# Patient Record
Sex: Male | Born: 1980 | Race: White | Hispanic: No | Marital: Married | Smoking: Former smoker
Health system: Southern US, Community
[De-identification: ages and names within clinical notes are randomized; demographics above are authoritative.]

## PROBLEM LIST (undated history)

## (undated) DIAGNOSIS — F411 Generalized anxiety disorder: Secondary | ICD-10-CM

## (undated) DIAGNOSIS — F3181 Bipolar II disorder: Secondary | ICD-10-CM

## (undated) DIAGNOSIS — K635 Polyp of colon: Secondary | ICD-10-CM

## (undated) DIAGNOSIS — F909 Attention-deficit hyperactivity disorder, unspecified type: Secondary | ICD-10-CM

## (undated) HISTORY — DX: Polyp of colon: K63.5

## (undated) HISTORY — DX: Bipolar II disorder: F31.81

## (undated) HISTORY — DX: Attention-deficit hyperactivity disorder, unspecified type: F90.9

## (undated) HISTORY — PX: SEPTOPLASTY: SUR1290

## (undated) HISTORY — DX: Morbid (severe) obesity due to excess calories: E66.01

## (undated) HISTORY — DX: Generalized anxiety disorder: F41.1

---

## 2017-06-22 DIAGNOSIS — R200 Anesthesia of skin: Secondary | ICD-10-CM | POA: Diagnosis not present

## 2017-06-22 DIAGNOSIS — M62830 Muscle spasm of back: Secondary | ICD-10-CM | POA: Diagnosis not present

## 2018-01-28 ENCOUNTER — Other Ambulatory Visit: Payer: Self-pay | Admitting: Nurse Practitioner

## 2018-01-28 ENCOUNTER — Ambulatory Visit
Admission: RE | Admit: 2018-01-28 | Discharge: 2018-01-28 | Disposition: A | Discharge status: Home / Self Care | Payer: 59 | Source: Ambulatory Visit | Attending: Nurse Practitioner | Admitting: Nurse Practitioner

## 2018-01-28 DIAGNOSIS — M62830 Muscle spasm of back: Secondary | ICD-10-CM

## 2018-01-28 DIAGNOSIS — M47816 Spondylosis without myelopathy or radiculopathy, lumbar region: Secondary | ICD-10-CM | POA: Diagnosis not present

## 2018-08-06 DIAGNOSIS — H608X9 Other otitis externa, unspecified ear: Secondary | ICD-10-CM | POA: Diagnosis not present

## 2019-05-06 IMAGING — CR DG LUMBAR SPINE 2-3V
3 series · 3 of 3 positions shown · non-contrast
Comparison: None.

CLINICAL DATA: Bilateral low back pain for 1 week, no injury

EXAM:
LUMBAR SPINE - 2-3 VIEW

[t l-spine a.p.]
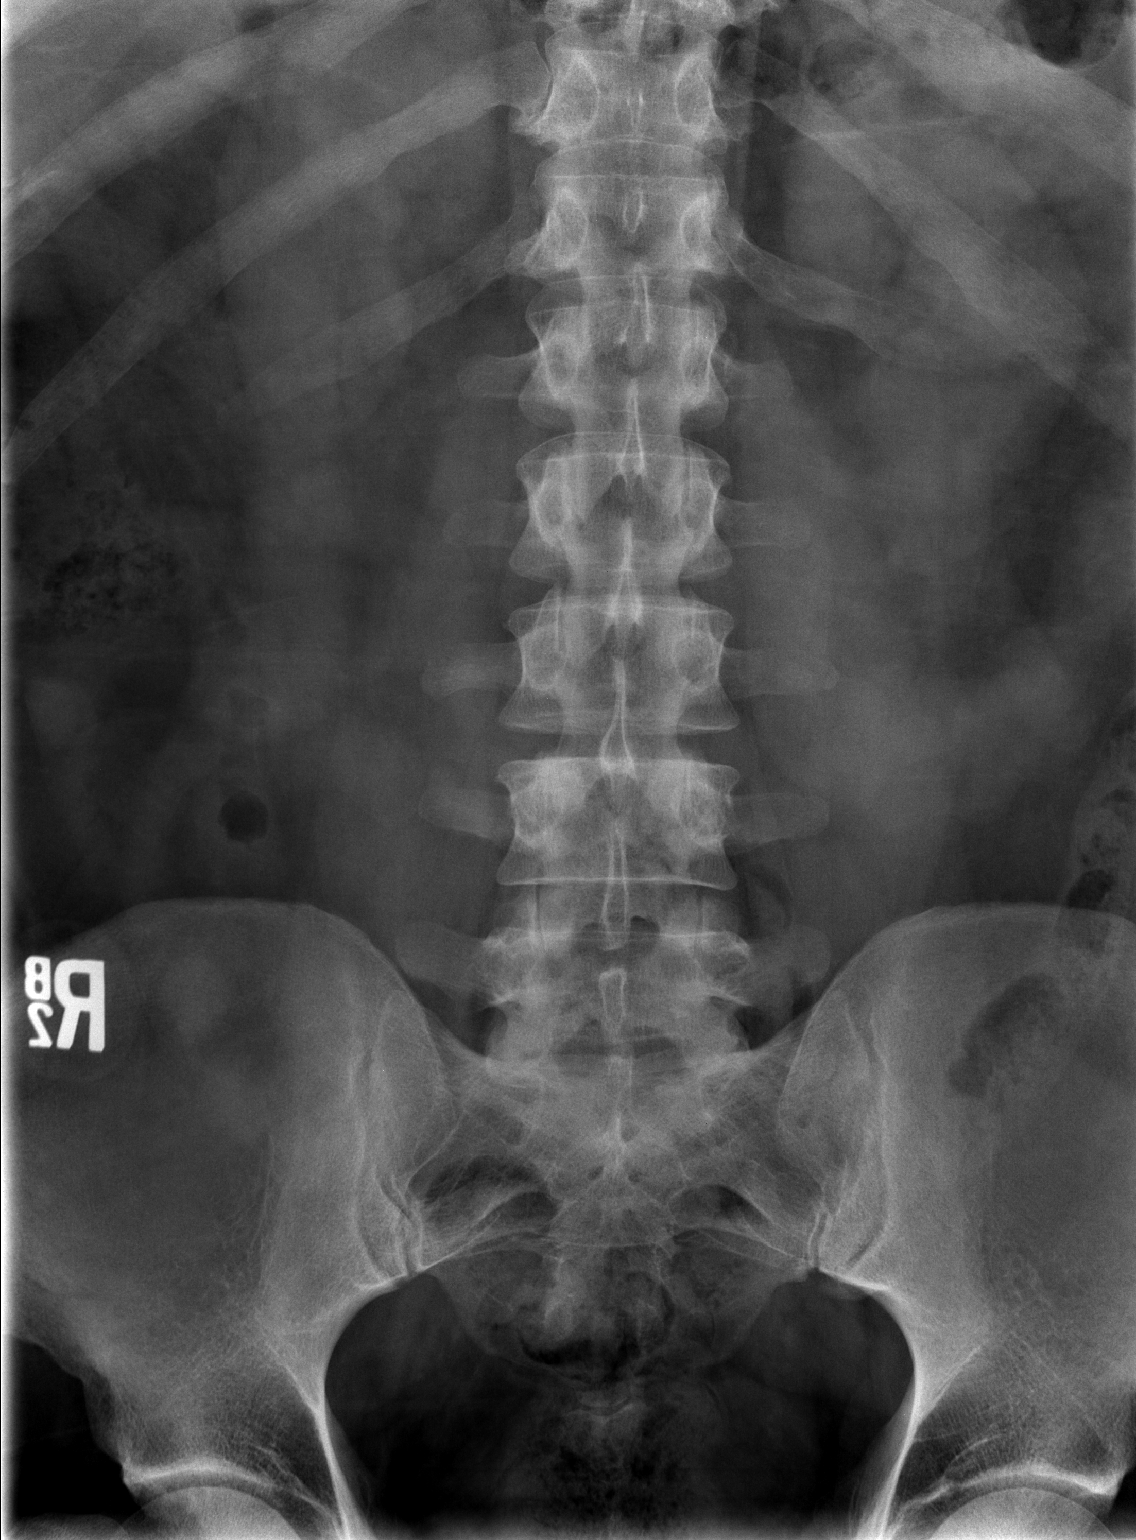

[t l-spine lat]
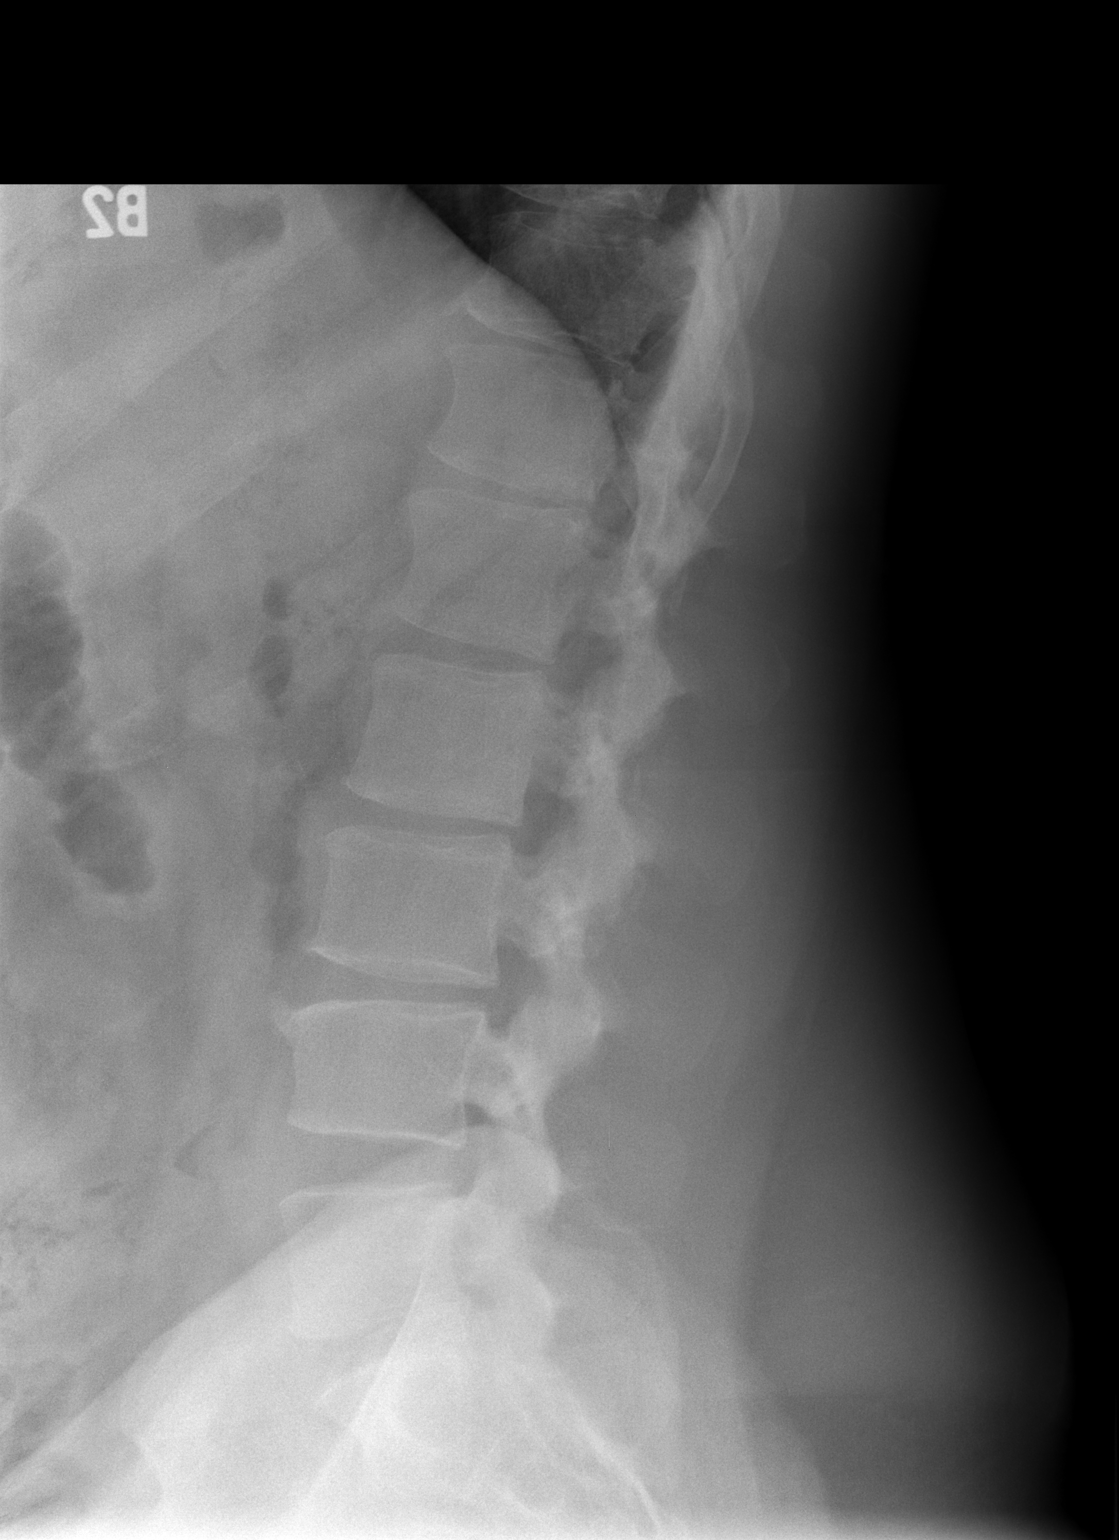

[t l-spine l5-s1 spot]
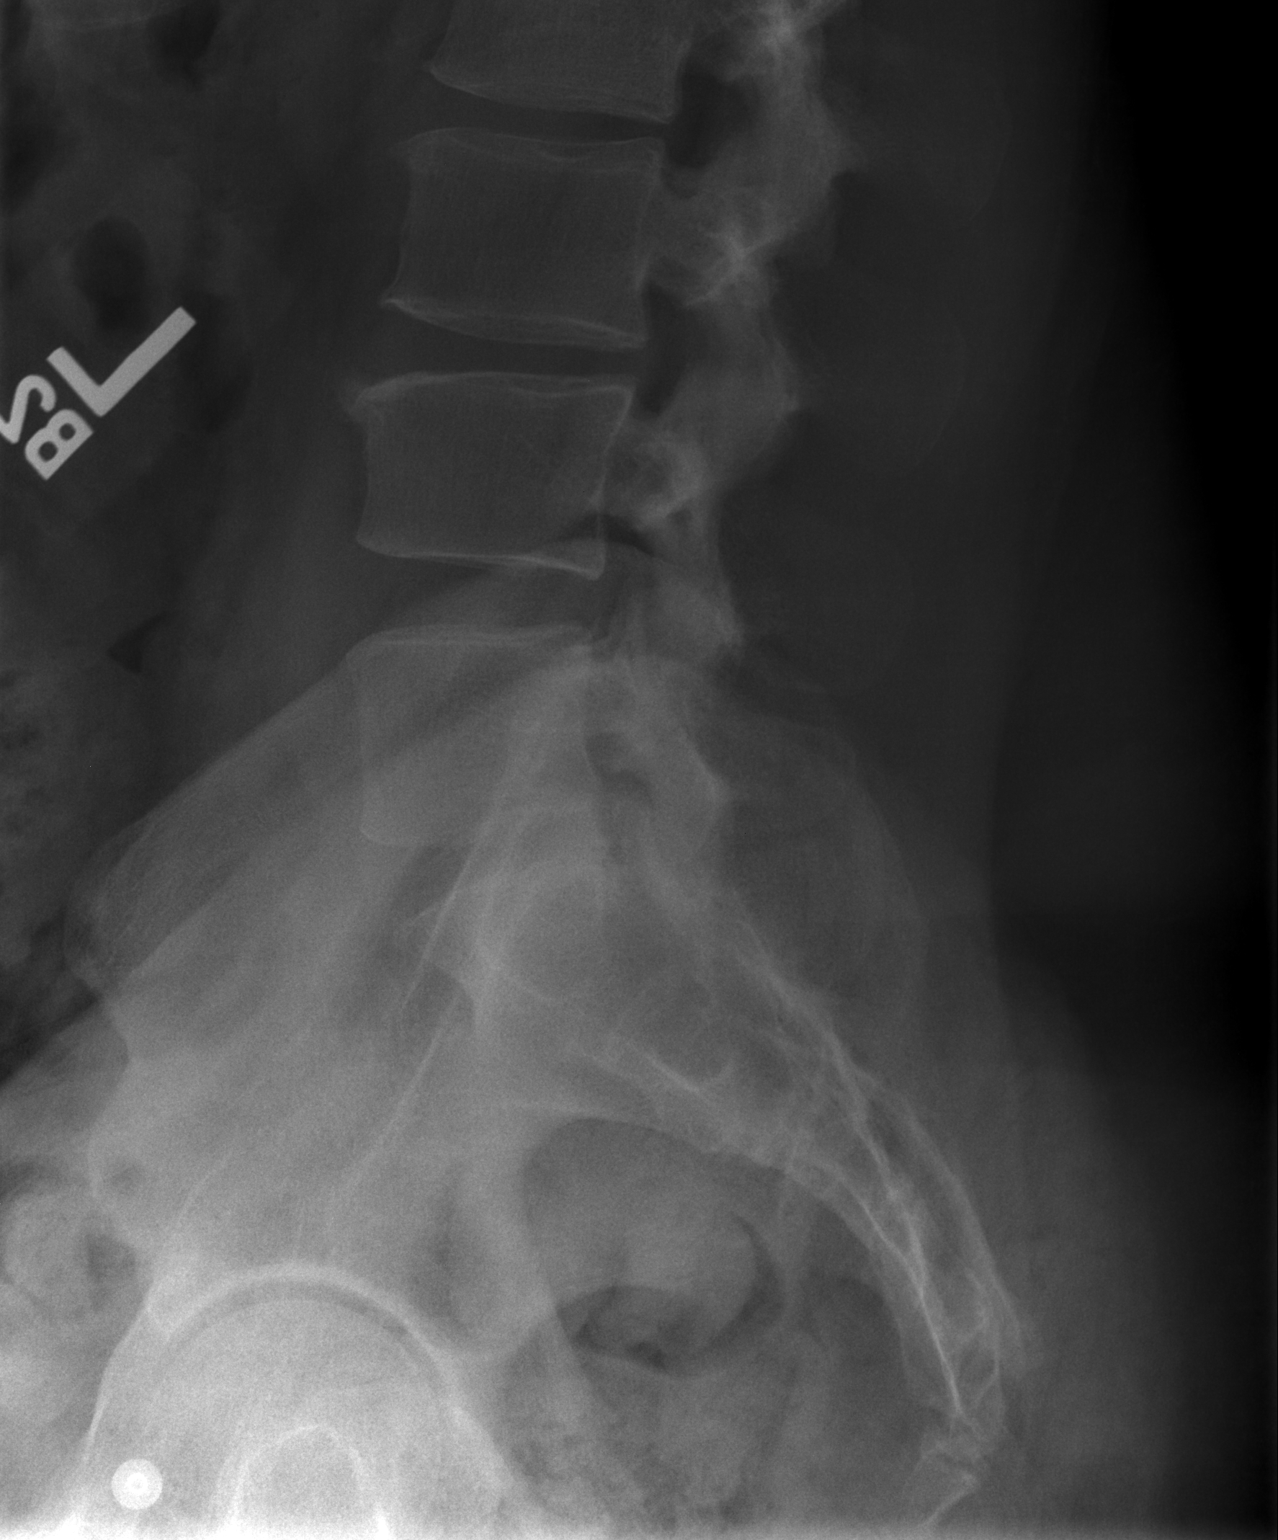

[3 of 3 positions shown; findings below may reference images not displayed]

FINDINGS: The lumbar vertebrae are in normal alignment. Intervertebral disc
spaces appear normal. Very mild degenerative spurring is noted
anteriorly at the L3-4 level. No compression deformity is seen. The
SI joints appear corticated. The bowel gas pattern is nonspecific.
IMPRESSION: Normal alignment with minimal degenerative change at L3-4. No
compression deformity.

## 2019-10-10 ENCOUNTER — Other Ambulatory Visit: Payer: Self-pay | Admitting: Otolaryngology

## 2019-10-10 DIAGNOSIS — J329 Chronic sinusitis, unspecified: Secondary | ICD-10-CM

## 2019-10-21 ENCOUNTER — Other Ambulatory Visit: Payer: Self-pay

## 2019-10-21 ENCOUNTER — Ambulatory Visit
Admission: RE | Admit: 2019-10-21 | Discharge: 2019-10-21 | Disposition: A | Discharge status: Home / Self Care | Payer: 59 | Source: Ambulatory Visit | Attending: Otolaryngology | Admitting: Otolaryngology

## 2019-10-21 DIAGNOSIS — J329 Chronic sinusitis, unspecified: Secondary | ICD-10-CM

## 2020-09-04 ENCOUNTER — Other Ambulatory Visit: Payer: Self-pay

## 2020-09-05 ENCOUNTER — Encounter: Payer: Self-pay | Admitting: Internal Medicine

## 2020-09-05 ENCOUNTER — Ambulatory Visit (INDEPENDENT_AMBULATORY_CARE_PROVIDER_SITE_OTHER): Payer: Managed Care, Other (non HMO) | Admitting: Internal Medicine

## 2020-09-05 DIAGNOSIS — F3181 Bipolar II disorder: Secondary | ICD-10-CM | POA: Diagnosis not present

## 2020-09-05 DIAGNOSIS — F900 Attention-deficit hyperactivity disorder, predominantly inattentive type: Secondary | ICD-10-CM | POA: Diagnosis not present

## 2020-09-05 DIAGNOSIS — F411 Generalized anxiety disorder: Secondary | ICD-10-CM | POA: Diagnosis not present

## 2020-09-05 DIAGNOSIS — F909 Attention-deficit hyperactivity disorder, unspecified type: Secondary | ICD-10-CM | POA: Insufficient documentation

## 2020-09-05 DIAGNOSIS — K635 Polyp of colon: Secondary | ICD-10-CM

## 2020-09-05 NOTE — Progress Notes (Signed)
New Patient Office Visit     This visit occurred during the SARS-CoV-2 public health emergency.  Safety protocols were in place, including screening questions prior to the visit, additional usage of staff PPE, and extensive cleaning of exam room while observing appropriate contact time as indicated for disinfecting solutions.    CC/Reason for Visit: Establish care, discuss chronic medical conditions Previous PCP: None Last Visit: Unknown  HPI: Barry Myers is a 40 y.o. male who is coming in today for the above mentioned reasons. Past Medical History is significant for: Bipolar 2 disorder, generalized anxiety disorder and ADHD.  [facility].  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  He has had a septoplasty as his only past surgical history.  He has allergies to cephalosporins which cause hives.  His family history significant for a paternal grandfather with colon cancer, his father has been diagnosed with precancerous polyps.  He started colonoscopies because of this at age 40, he has also been diagnosed with precancerous polyps and has colonoscopies every 5 years by Dr. Elnoria Howard.  He is also morbidly obese with a BMI of 42.5.  He is wondering if I would take over his mental health prescriptions.  These include Abilify, buspirone, Vyvanse, Rexulti.  Recently changed insurance plans and and now it is cost-probitive for him to go to the [facility], they advised he seek a general practitioner.  He has no acute complaints today.   Past Medical/Surgical History: Past Medical History:  Diagnosis Date  . ADHD   . Bipolar 2 disorder (HCC)   . Colon polyps   . GAD (generalized anxiety disorder)   . Morbid obesity (HCC)     Past Surgical History:  Procedure Laterality Date  . SEPTOPLASTY      Social History:  reports that he has quit smoking. He quit after 18.00 years of use. He has never used smokeless tobacco. He reports  current alcohol use. No history on file for drug use.  Allergies: Allergies  Allergen Reactions  . Aleve [Naproxen] Hives  . Benzoyl Peroxide Hives  . Ceclor [Cefaclor] Hives    Family History:  Family History  Problem Relation Age of Onset  . Colon polyps Father   . Colon cancer Paternal Grandfather      Current Outpatient Medications:  .  ARIPiprazole (ABILIFY) 5 MG tablet, Take 5 mg by mouth daily., Disp: , Rfl:  .  busPIRone (BUSPAR) 30 MG tablet, Take 30 mg by mouth 2 (two) times daily., Disp: , Rfl:  .  finasteride (PROPECIA) 1 MG tablet, Take 1 mg by mouth daily., Disp: , Rfl:  .  REXULTI 2 MG TABS tablet, Take 2 mg by mouth daily., Disp: , Rfl:  .  VYVANSE 60 MG capsule, Take 60 mg by mouth every morning., Disp: , Rfl:   Review of Systems:  Constitutional: Denies fever, chills, diaphoresis, appetite change and fatigue.  HEENT: Denies photophobia, eye pain, redness, hearing loss, ear pain, congestion, sore throat, rhinorrhea, sneezing, mouth sores, trouble swallowing, neck pain, neck stiffness and tinnitus.   Respiratory: Denies SOB, DOE, cough, chest tightness,  and wheezing.   Cardiovascular: Denies chest pain, palpitations and leg swelling.  Gastrointestinal: Denies nausea, vomiting, abdominal pain, diarrhea, constipation, blood in stool and abdominal distention.  Genitourinary: Denies dysuria, urgency, frequency, hematuria, flank pain and difficulty urinating.  Endocrine: Denies: hot or cold intolerance, sweats, changes in hair or nails, polyuria, polydipsia.  Musculoskeletal: Denies myalgias, back pain, joint swelling, arthralgias and gait problem.  Skin: Denies pallor, rash and wound.  Neurological: Denies dizziness, seizures, syncope, weakness, light-headedness, numbness and headaches.  Hematological: Denies adenopathy. Easy bruising, personal or family bleeding history  Psychiatric/Behavioral: Denies suicidal ideation, mood changes, confusion, nervousness, sleep  disturbance and agitation    Physical Exam: Vitals:   09/05/20 1535  BP: 122/82  Pulse: 90  Temp: 98.1 F (36.7 C)  TempSrc: Oral  SpO2: 98%  Weight: (!) 305 lb 3.2 oz (138.4 kg)  Height: 5\' 11"  (1.803 m)   Body mass index is 42.57 kg/m.  Constitutional: NAD, calm, comfortable Eyes: PERRL, lids and conjunctivae normal ENMT: Mucous membranes are moist.  Neurologic: Grossly intact and nonfocal Psychiatric: Normal judgment and insight. Alert and oriented x 3. Normal mood.    Impression and Plan:  Bipolar 2 disorder (HCC) Attention deficit hyperactivity disorder (ADHD), predominantly inattentive type GAD (generalized anxiety disorder) -Have advised patient that I do not feel comfortable becoming his main mental health provider and taking over his prescriptions given multitude of medications and complexity of psychiatric diagnoses. -Have provided with several brochures with information on local mental health services. -I would be happy to see him however for continued medical care.  Polyp of colon, unspecified part of colon, unspecified type -Continue accelerated colonoscopy scheduled due to personal and family history.  Morbid obesity (HCC) -Discussed healthy lifestyle, including increased physical activity and better food choices to promote weight loss.      , MD [facility] at [facility]

## 2020-11-05 ENCOUNTER — Other Ambulatory Visit: Payer: Self-pay

## 2020-11-06 ENCOUNTER — Ambulatory Visit (INDEPENDENT_AMBULATORY_CARE_PROVIDER_SITE_OTHER): Payer: Managed Care, Other (non HMO) | Admitting: Internal Medicine

## 2020-11-06 ENCOUNTER — Encounter: Payer: Self-pay | Admitting: Internal Medicine

## 2020-11-06 VITALS — BP 122/86 | HR 89 | Temp 98.1°F | Ht 71.0 in | Wt 303.7 lb

## 2020-11-06 DIAGNOSIS — F900 Attention-deficit hyperactivity disorder, predominantly inattentive type: Secondary | ICD-10-CM

## 2020-11-06 DIAGNOSIS — F411 Generalized anxiety disorder: Secondary | ICD-10-CM

## 2020-11-06 DIAGNOSIS — Z0000 Encounter for general adult medical examination without abnormal findings: Secondary | ICD-10-CM | POA: Diagnosis not present

## 2020-11-06 DIAGNOSIS — F3181 Bipolar II disorder: Secondary | ICD-10-CM

## 2020-11-06 DIAGNOSIS — K635 Polyp of colon: Secondary | ICD-10-CM | POA: Diagnosis not present

## 2020-11-06 NOTE — Patient Instructions (Signed)
-  Nice seeing you today!!  -please return fasting for labs.  -Schedule follow up in 1 year or sooner as needed.

## 2020-11-06 NOTE — Progress Notes (Signed)
Established Patient Office Visit     This visit occurred during the SARS-CoV-2 public health emergency.  Safety protocols were in place, including screening questions prior to the visit, additional usage of staff PPE, and extensive cleaning of exam room while observing appropriate contact time as indicated for disinfecting solutions.    CC/Reason for Visit: Annual preventive exam  HPI: Barry Myers is a 40 y.o. male who is coming in today for the above mentioned reasons. Past Medical History is significant for: Morbid obesity, he is also followed by psychiatry for history of bipolar 2 disorder, generalized anxiety disorder and ADHD.  He has been doing well since we last spoke and has no acute concerns or complaints today.  He has a colonoscopy every 5 years due to history of colon polyps, was last done in 2021.  He has no personal or family history of prostate cancer.  All immunizations are up-to-date including COVID, Tdap, flu.  He is overdue for eye and dental care.  He exercises by walking on an almost daily basis.   Past Medical/Surgical History: Past Medical History:  Diagnosis Date   ADHD    Bipolar 2 disorder (HCC)    Colon polyps    GAD (generalized anxiety disorder)    Morbid obesity (HCC)     Past Surgical History:  Procedure Laterality Date   SEPTOPLASTY      Social History:  reports that he has quit smoking. His smoking use included cigarettes. He has never used smokeless tobacco. He reports current alcohol use. No history on file for drug use.  Allergies: Allergies  Allergen Reactions   Aleve [Naproxen] Hives   Benzoyl Peroxide Hives   Ceclor [Cefaclor] Hives    Family History:  Family History  Problem Relation Age of Onset   Colon polyps Father    Colon cancer Paternal Grandfather      Current Outpatient Medications:    ARIPiprazole (ABILIFY) 5 MG tablet, Take 5 mg by mouth daily., Disp: , Rfl:    busPIRone (BUSPAR) 30 MG tablet, Take 30 mg by  mouth 2 (two) times daily., Disp: , Rfl:    finasteride (PROPECIA) 1 MG tablet, Take 1 mg by mouth daily., Disp: , Rfl:    REXULTI 2 MG TABS tablet, Take 2 mg by mouth daily., Disp: , Rfl:    VYVANSE 60 MG capsule, Take 60 mg by mouth every morning., Disp: , Rfl:   Review of Systems:  Constitutional: Denies fever, chills, diaphoresis, appetite change and fatigue.  HEENT: Denies photophobia, eye pain, redness, hearing loss, ear pain, congestion, sore throat, rhinorrhea, sneezing, mouth sores, trouble swallowing, neck pain, neck stiffness and tinnitus.   Respiratory: Denies SOB, DOE, cough, chest tightness,  and wheezing.   Cardiovascular: Denies chest pain, palpitations and leg swelling.  Gastrointestinal: Denies nausea, vomiting, abdominal pain, diarrhea, constipation, blood in stool and abdominal distention.  Genitourinary: Denies dysuria, urgency, frequency, hematuria, flank pain and difficulty urinating.  Endocrine: Denies: hot or cold intolerance, sweats, changes in hair or nails, polyuria, polydipsia. Musculoskeletal: Denies myalgias, back pain, joint swelling, arthralgias and gait problem.  Skin: Denies pallor, rash and wound.  Neurological: Denies dizziness, seizures, syncope, weakness, light-headedness, numbness and headaches.  Hematological: Denies adenopathy. Easy bruising, personal or family bleeding history  Psychiatric/Behavioral: Denies suicidal ideation, mood changes, confusion, nervousness, sleep disturbance and agitation    Physical Exam: Vitals:   11/06/20 1458  BP: 122/86  Pulse: 89  Temp: 98.1 F (36.7 C)  TempSrc: Oral  SpO2: 97%  Weight: (!) 303 lb 11.2 oz (137.8 kg)  Height: 5\' 11"  (1.803 m)    Body mass index is 42.36 kg/m.   Constitutional: NAD, calm, comfortable Eyes: PERRL, lids and conjunctivae normal ENMT: Mucous membranes are moist. Posterior pharynx clear of any exudate or lesions. Normal dentition. Tympanic membrane is pearly white, no erythema  or bulging. Neck: normal, supple, no masses, no thyromegaly Respiratory: clear to auscultation bilaterally, no wheezing, no crackles. Normal respiratory effort. No accessory muscle use.  Cardiovascular: Regular rate and rhythm, no murmurs / rubs / gallops. No extremity edema. 2+ pedal pulses. No carotid bruits.  Abdomen: no tenderness, no masses palpated. No hepatosplenomegaly. Bowel sounds positive.  Musculoskeletal: no clubbing / cyanosis. No joint deformity upper and lower extremities. Good ROM, no contractures. Normal muscle tone.  Skin: no rashes, lesions, ulcers. No induration Neurologic: CN 2-12 grossly intact. Sensation intact, DTR normal. Strength 5/5 in all 4.  Psychiatric: Normal judgment and insight. Alert and oriented x 3. Normal mood.    Impression and Plan:  Encounter for preventive health examination -Advised routine eye and dental care. -All immunizations are up-to-date including COVID and Tdap. -He will return fasting for labs. -Healthy lifestyle discussed in detail. -Early colonoscopy due to history of colon polyps, last in 2021, followed by Dr. 2022.  Morbid obesity (HCC) -Discussed healthy lifestyle, including increased physical activity and better food choices to promote weight loss.  Polyp of colon, unspecified part of colon, unspecified type -Last colonoscopy in 2021 with plans for every 5 year follow-up.  Bipolar 2 disorder (HCC)  Attention deficit hyperactivity disorder (ADHD), predominantly inattentive type GAD (generalized anxiety disorder) Flowsheet Row Office Visit from 11/06/2020 in [facility] [place]  PHQ-9 Total Score 11     -Mood is stable, followed by psychiatry.    Patient Instructions  -Nice seeing you today!!  -please return fasting for labs.  -Schedule follow up in 1 year or sooner as needed.      [place], MD [facility] at [facility]

## 2020-11-08 ENCOUNTER — Other Ambulatory Visit (INDEPENDENT_AMBULATORY_CARE_PROVIDER_SITE_OTHER): Payer: Managed Care, Other (non HMO)

## 2020-11-08 ENCOUNTER — Encounter: Payer: Self-pay | Admitting: Internal Medicine

## 2020-11-08 ENCOUNTER — Other Ambulatory Visit: Payer: Self-pay

## 2020-11-08 DIAGNOSIS — R7302 Impaired glucose tolerance (oral): Secondary | ICD-10-CM | POA: Insufficient documentation

## 2020-11-08 DIAGNOSIS — F3181 Bipolar II disorder: Secondary | ICD-10-CM

## 2020-11-08 DIAGNOSIS — E785 Hyperlipidemia, unspecified: Secondary | ICD-10-CM | POA: Insufficient documentation

## 2020-11-08 LAB — CBC WITH DIFFERENTIAL/PLATELET
Basophils Absolute: 0.1 10*3/uL (ref 0.0–0.1)
Basophils Relative: 1.2 % (ref 0.0–3.0)
Eosinophils Absolute: 0.7 10*3/uL (ref 0.0–0.7)
Eosinophils Relative: 9.9 % — ABNORMAL HIGH (ref 0.0–5.0)
HCT: 43.8 % (ref 39.0–52.0)
Hemoglobin: 14.8 g/dL (ref 13.0–17.0)
Lymphocytes Relative: 31.6 % (ref 12.0–46.0)
Lymphs Abs: 2.1 10*3/uL (ref 0.7–4.0)
MCHC: 33.8 g/dL (ref 30.0–36.0)
MCV: 84.6 fl (ref 78.0–100.0)
Monocytes Absolute: 0.6 10*3/uL (ref 0.1–1.0)
Monocytes Relative: 8.7 % (ref 3.0–12.0)
Neutro Abs: 3.3 10*3/uL (ref 1.4–7.7)
Neutrophils Relative %: 48.6 % (ref 43.0–77.0)
Platelets: 323 10*3/uL (ref 150.0–400.0)
RBC: 5.17 Mil/uL (ref 4.22–5.81)
RDW: 12.8 % (ref 11.5–15.5)
WBC: 6.7 10*3/uL (ref 4.0–10.5)

## 2020-11-08 LAB — COMPREHENSIVE METABOLIC PANEL
ALT: 29 U/L (ref 0–53)
AST: 20 U/L (ref 0–37)
Albumin: 4.2 g/dL (ref 3.5–5.2)
Alkaline Phosphatase: 78 U/L (ref 39–117)
BUN: 17 mg/dL (ref 6–23)
CO2: 25 mEq/L (ref 19–32)
Calcium: 8.9 mg/dL (ref 8.4–10.5)
Chloride: 105 mEq/L (ref 96–112)
Creatinine, Ser: 1.06 mg/dL (ref 0.40–1.50)
GFR: 87.94 mL/min (ref >60.00)
Glucose, Bld: 105 mg/dL — ABNORMAL HIGH (ref 70–99)
Potassium: 4.6 mEq/L (ref 3.5–5.1)
Sodium: 138 mEq/L (ref 135–145)
Total Bilirubin: 0.6 mg/dL (ref 0.2–1.2)
Total Protein: 6.8 g/dL (ref 6.0–8.3)

## 2020-11-08 LAB — LIPID PANEL
Cholesterol: 198 mg/dL (ref 0–200)
HDL: 37.8 mg/dL — ABNORMAL LOW (ref >39.00)
LDL Cholesterol: 135 mg/dL — ABNORMAL HIGH (ref 0–99)
NonHDL: 160.05
Total CHOL/HDL Ratio: 5
Triglycerides: 126 mg/dL (ref 0.0–149.0)
VLDL: 25.2 mg/dL (ref 0.0–40.0)

## 2020-11-08 LAB — VITAMIN D 25 HYDROXY (VIT D DEFICIENCY, FRACTURES): VITD: 35.32 ng/mL (ref 30.00–100.00)

## 2020-11-08 LAB — TSH: TSH: 1.56 u[IU]/mL (ref 0.35–5.50)

## 2020-11-08 LAB — HEMOGLOBIN A1C: Hgb A1c MFr Bld: 5.9 % (ref 4.6–6.5)

## 2020-11-08 LAB — VITAMIN B12: Vitamin B-12: 276 pg/mL (ref 211–911)

## 2021-05-21 ENCOUNTER — Ambulatory Visit (INDEPENDENT_AMBULATORY_CARE_PROVIDER_SITE_OTHER): Payer: Managed Care, Other (non HMO) | Admitting: Internal Medicine

## 2021-05-21 ENCOUNTER — Other Ambulatory Visit: Payer: Self-pay | Admitting: Internal Medicine

## 2021-05-21 VITALS — BP 130/80 | HR 97 | Temp 97.7°F | Wt 304.7 lb

## 2021-05-21 DIAGNOSIS — F900 Attention-deficit hyperactivity disorder, predominantly inattentive type: Secondary | ICD-10-CM | POA: Diagnosis not present

## 2021-05-21 DIAGNOSIS — R7302 Impaired glucose tolerance (oral): Secondary | ICD-10-CM | POA: Diagnosis not present

## 2021-05-21 DIAGNOSIS — E785 Hyperlipidemia, unspecified: Secondary | ICD-10-CM

## 2021-05-21 DIAGNOSIS — E782 Mixed hyperlipidemia: Secondary | ICD-10-CM

## 2021-05-21 DIAGNOSIS — F411 Generalized anxiety disorder: Secondary | ICD-10-CM

## 2021-05-21 DIAGNOSIS — F3181 Bipolar II disorder: Secondary | ICD-10-CM

## 2021-05-21 LAB — LIPID PANEL
Cholesterol: 221 mg/dL — ABNORMAL HIGH (ref 0–200)
HDL: 41.8 mg/dL (ref >39.00)
NonHDL: 178.94
Total CHOL/HDL Ratio: 5
Triglycerides: 213 mg/dL — ABNORMAL HIGH (ref 0.0–149.0)
VLDL: 42.6 mg/dL — ABNORMAL HIGH (ref 0.0–40.0)

## 2021-05-21 LAB — POCT GLYCOSYLATED HEMOGLOBIN (HGB A1C): Hemoglobin A1C: 5.5 % (ref 4.0–5.6)

## 2021-05-21 LAB — LDL CHOLESTEROL, DIRECT: Direct LDL: 161 mg/dL

## 2021-05-21 MED ORDER — ATORVASTATIN CALCIUM 40 MG PO TABS: 40.0000 mg | ORAL_TABLET | Freq: Every day | ORAL | 1 refills | Status: DC

## 2021-05-21 NOTE — Progress Notes (Signed)
Established Patient Office Visit     This visit occurred during the SARS-CoV-2 public health emergency.  Safety protocols were in place, including screening questions prior to the visit, additional usage of staff PPE, and extensive cleaning of exam room while observing appropriate contact time as indicated for disinfecting solutions.    CC/Reason for Visit: 25-month follow-up chronic conditions  HPI: Barry Myers is a 41 y.o. male who is coming in today for the above mentioned reasons. Past Medical History is significant for: Morbid obesity, impaired glucose tolerance, hyperlipidemia not on medication, history of ADHD, anxiety and bipolar 2 disorder followed by psychiatry.  He was recently started on temazepam for sleep.  He declines flu vaccine.  He states he received his COVID booster in October.  He has no acute concerns or complaints today.   Past Medical/Surgical History: Past Medical History:  Diagnosis Date   ADHD    Bipolar 2 disorder ([place])    Colon polyps    GAD (generalized anxiety disorder)    Morbid obesity ([place])     Past Surgical History:  Procedure Laterality Date   SEPTOPLASTY      Social History:  reports that he has quit smoking. His smoking use included cigarettes. He has never used smokeless tobacco. He reports current alcohol use. No history on file for drug use.  Allergies: Allergies  Allergen Reactions   Aleve [Naproxen] Hives   Benzoyl Peroxide Hives   Ceclor [Cefaclor] Hives    Family History:  Family History  Problem Relation Age of Onset   Colon polyps Father    Colon cancer Paternal Grandfather      Current Outpatient Medications:    ARIPiprazole (ABILIFY) 5 MG tablet, Take 5 mg by mouth daily., Disp: , Rfl:    busPIRone (BUSPAR) 30 MG tablet, Take 30 mg by mouth 2 (two) times daily., Disp: , Rfl:    finasteride (PROPECIA) 1 MG tablet, Take 1 mg by mouth daily., Disp: , Rfl:    REXULTI 3 MG TABS, Take 1 tablet by mouth daily., Disp: ,  Rfl:    temazepam (RESTORIL) 30 MG capsule, Take 30 mg by mouth at bedtime., Disp: , Rfl:    VYVANSE 50 MG capsule, Take 50 mg by mouth every morning., Disp: , Rfl:   Review of Systems:  Constitutional: Denies fever, chills, diaphoresis, appetite change and fatigue.  HEENT: Denies photophobia, eye pain, redness, hearing loss, ear pain, congestion, sore throat, rhinorrhea, sneezing, mouth sores, trouble swallowing, neck pain, neck stiffness and tinnitus.   Respiratory: Denies SOB, DOE, cough, chest tightness,  and wheezing.   Cardiovascular: Denies chest pain, palpitations and leg swelling.  Gastrointestinal: Denies nausea, vomiting, abdominal pain, diarrhea, constipation, blood in stool and abdominal distention.  Genitourinary: Denies dysuria, urgency, frequency, hematuria, flank pain and difficulty urinating.  Endocrine: Denies: hot or cold intolerance, sweats, changes in hair or nails, polyuria, polydipsia. Musculoskeletal: Denies myalgias, back pain, joint swelling, arthralgias and gait problem.  Skin: Denies pallor, rash and wound.  Neurological: Denies dizziness, seizures, syncope, weakness, light-headedness, numbness and headaches.  Hematological: Denies adenopathy. Easy bruising, personal or family bleeding history  Psychiatric/Behavioral: Denies suicidal ideation, mood changes, confusion, nervousness, sleep disturbance and agitation    Physical Exam: Vitals:   05/21/21 0708  BP: 130/80  Pulse: 97  Temp: 97.7 F (36.5 C)  TempSrc: Oral  SpO2: 96%  Weight: (!) 304 lb 11.2 oz (138.2 kg)    Body mass index is 42.5 kg/m.   Constitutional:  NAD, calm, comfortable, obese Eyes: PERRL, lids and conjunctivae normal ENMT: Mucous membranes are moist.  Respiratory: clear to auscultation bilaterally, no wheezing, no crackles. Normal respiratory effort. No accessory muscle use.  Cardiovascular: Regular rate and rhythm, no murmurs / rubs / gallops. No extremity edema.  Neurologic:  Grossly intact and nonfocal Psychiatric: Normal judgment and insight. Alert and oriented x 3. Normal mood.    Impression and Plan:  IGT (impaired glucose tolerance)  - Plan: POCT HgB A1C -In office A1c improved from 5.9-5.5, continue to follow every 6 months.  Hyperlipidemia, unspecified hyperlipidemia type  - Plan: Lipid panel -Last lipid panel in July 2022 with a total cholesterol of 198, triglycerides 126 and LDL 135.  Morbid obesity ([place]) -Discussed healthy lifestyle, including increased physical activity and better food choices to promote weight loss.  Attention deficit hyperactivity disorder (ADHD), predominantly inattentive type GAD (generalized anxiety disorder) Bipolar 2 disorder ([place]) -Followed by psychiatry.  Time spent: 31 minutes reviewing chart, interviewing and examining patient and formulating plan of care.    Lelon Frohlich, MD [facility] at [facility]

## 2021-07-22 ENCOUNTER — Other Ambulatory Visit: Payer: Managed Care, Other (non HMO)

## 2021-07-25 ENCOUNTER — Encounter: Payer: Self-pay | Admitting: Internal Medicine

## 2021-07-25 ENCOUNTER — Ambulatory Visit (INDEPENDENT_AMBULATORY_CARE_PROVIDER_SITE_OTHER): Payer: Managed Care, Other (non HMO) | Admitting: Internal Medicine

## 2021-07-25 VITALS — BP 110/80 | HR 82 | Temp 97.5°F | Wt 310.5 lb

## 2021-07-25 DIAGNOSIS — E782 Mixed hyperlipidemia: Secondary | ICD-10-CM

## 2021-07-25 DIAGNOSIS — R7302 Impaired glucose tolerance (oral): Secondary | ICD-10-CM

## 2021-07-25 LAB — LIPID PANEL
Cholesterol: 139 mg/dL (ref 0–200)
HDL: 39.4 mg/dL (ref >39.00)
LDL Cholesterol: 74 mg/dL (ref 0–99)
NonHDL: 99.12
Total CHOL/HDL Ratio: 4
Triglycerides: 128 mg/dL (ref 0.0–149.0)
VLDL: 25.6 mg/dL (ref 0.0–40.0)

## 2021-07-25 MED ORDER — SEMAGLUTIDE(0.25 OR 0.5MG/DOS) 2 MG/1.5ML ~~LOC~~ SOPN: 0.5000 mg | PEN_INJECTOR | SUBCUTANEOUS | 0 refills | Status: AC

## 2021-07-25 NOTE — Patient Instructions (Signed)
-  Nice seeing you today!! ? ?-Lab work today; will notify you once results are available. ? ?-Start Semaglutide Start at 0.25 mg weekly for the first 4 weeks and then increase to 0.5 mg weekly. ? ?-Schedule follow up in 3 months. ?

## 2021-07-25 NOTE — Progress Notes (Signed)
? ? ? ?Established Patient Office Visit ? ? ? ? ?This visit occurred during the SARS-CoV-2 public health emergency.  Safety protocols were in place, including screening questions prior to the visit, additional usage of staff PPE, and extensive cleaning of exam room while observing appropriate contact time as indicated for disinfecting solutions.  ? ? ?CC/Reason for Visit: Follow-up chronic conditions ? ?HPI: Barry Myers is a 41 y.o. male who is coming in today for the above mentioned reasons. Past Medical History is significant for: Morbid obesity, hyperlipidemia, impaired glucose tolerance, ADHD and bipolar 2 disorder/anxiety followed by psychiatry.  At last visit he was started on atorvastatin 40 mg daily due to elevated cholesterol panel.  He is here today for follow-up.  He is tolerating medication well with no perceived side effects.  He is wondering about starting semaglutide for his prediabetes and obesity. ? ? ?Past Medical/Surgical History: ?Past Medical History:  ?Diagnosis Date  ? ADHD   ? Bipolar 2 disorder (HCC)   ? Colon polyps   ? GAD (generalized anxiety disorder)   ? Morbid obesity (HCC)   ? ? ?Past Surgical History:  ?Procedure Laterality Date  ? SEPTOPLASTY    ? ? ?Social History: ? reports that he has quit smoking. His smoking use included cigarettes. He has never used smokeless tobacco. He reports current alcohol use. No history on file for drug use. ? ?Allergies: ?Allergies  ?Allergen Reactions  ? Aleve [Naproxen] Hives  ? Benzoyl Peroxide Hives  ? Ceclor [Cefaclor] Hives  ? ? ?Family History:  ?Family History  ?Problem Relation Age of Onset  ? Colon polyps Father   ? Colon cancer Paternal Grandfather   ? ? ? ?Current Outpatient Medications:  ?  ARIPiprazole (ABILIFY) 15 MG tablet, Take 15 mg by mouth daily., Disp: , Rfl:  ?  atorvastatin (LIPITOR) 40 MG tablet, Take 1 tablet (40 mg total) by mouth daily., Disp: 90 tablet, Rfl: 1 ?  busPIRone (BUSPAR) 30 MG tablet, Take 30 mg by mouth 2 (two)  times daily., Disp: , Rfl:  ?  finasteride (PROPECIA) 1 MG tablet, Take 1 mg by mouth daily., Disp: , Rfl:  ?  Semaglutide,0.25 or 0.5MG /DOS, 2 MG/1.5ML SOPN, Inject 0.5 mg into the skin once a week. Start at 0.25 mg weekly for the first 4 weeks and then increase to 0.5 mg weekly., Disp: 4.5 mL, Rfl: 0 ?  temazepam (RESTORIL) 30 MG capsule, Take 30 mg by mouth at bedtime., Disp: , Rfl:  ?  VYVANSE 50 MG capsule, Take 50 mg by mouth every morning., Disp: , Rfl:  ? ?Review of Systems:  ?Constitutional: Denies fever, chills, diaphoresis, appetite change and fatigue.  ?HEENT: Denies photophobia, eye pain, redness, hearing loss, ear pain, congestion, sore throat, rhinorrhea, sneezing, mouth sores, trouble swallowing, neck pain, neck stiffness and tinnitus.   ?Respiratory: Denies SOB, DOE, cough, chest tightness,  and wheezing.   ?Cardiovascular: Denies chest pain, palpitations and leg swelling.  ?Gastrointestinal: Denies nausea, vomiting, abdominal pain, diarrhea, constipation, blood in stool and abdominal distention.  ?Genitourinary: Denies dysuria, urgency, frequency, hematuria, flank pain and difficulty urinating.  ?Endocrine: Denies: hot or cold intolerance, sweats, changes in hair or nails, polyuria, polydipsia. ?Musculoskeletal: Denies myalgias, back pain, joint swelling, arthralgias and gait problem.  ?Skin: Denies pallor, rash and wound.  ?Neurological: Denies dizziness, seizures, syncope, weakness, light-headedness, numbness and headaches.  ?Hematological: Denies adenopathy. Easy bruising, personal or family bleeding history  ?Psychiatric/Behavioral: Denies suicidal ideation, mood changes, confusion, nervousness, sleep disturbance  and agitation ? ? ? ?Physical Exam: ?Vitals:  ? 07/25/21 0722  ?BP: 110/80  ?Pulse: 82  ?Temp: (!) 97.5 ?F (36.4 ?C)  ?TempSrc: Oral  ?SpO2: 98%  ?Weight: (!) 310 lb 8 oz (140.8 kg)  ? ? ?Body mass index is 43.31 kg/m?. ? ? ?Constitutional: NAD, calm, comfortable ?Eyes: PERRL, lids and  conjunctivae normal ?ENMT: Mucous membranes are moist.  ?Respiratory: clear to auscultation bilaterally, no wheezing, no crackles. Normal respiratory effort. No accessory muscle use.  ?Cardiovascular: Regular rate and rhythm, no murmurs / rubs / gallops. No extremity edema.  ?Neurologic: Grossly intact and nonfocal ?Psychiatric: Normal judgment and insight. Alert and oriented x 3. Normal mood.  ? ? ?Impression and Plan: ? ?Mixed hyperlipidemia - Plan: Lipid panel ? ?Morbid obesity (HCC) ? ?IGT (impaired glucose tolerance) - Plan: Semaglutide,0.25 or 0.5MG /DOS, 2 MG/1.5ML SOPN ? ?-Recheck lipid panel today, he has been on atorvastatin 40 mg for 3 months. ?-I agree with starting semaglutide, we have discussed side effects and healthy lifestyle changes.  He will return in 3 months for follow-up. ? ? ? ?Time spent:34 minutes reviewing chart, interviewing and examining patient and formulating plan of care. ? ? ?Patient Instructions  ?-Nice seeing you today!! ? ?-Lab work today; will notify you once results are available. ? ?-Start Semaglutide Start at 0.25 mg weekly for the first 4 weeks and then increase to 0.5 mg weekly. ? ?-Schedule follow up in 3 months. ? ? ? ?Chaya Jan, MD ?[facility] at [facility] ? ? ?

## 2021-10-24 ENCOUNTER — Encounter: Payer: Self-pay | Admitting: Internal Medicine

## 2021-10-24 ENCOUNTER — Ambulatory Visit (INDEPENDENT_AMBULATORY_CARE_PROVIDER_SITE_OTHER): Payer: Managed Care, Other (non HMO) | Admitting: Internal Medicine

## 2021-10-24 VITALS — BP 102/72 | HR 83 | Temp 97.4°F | Ht 71.0 in | Wt 302.1 lb

## 2021-10-24 DIAGNOSIS — F411 Generalized anxiety disorder: Secondary | ICD-10-CM

## 2021-10-24 DIAGNOSIS — F900 Attention-deficit hyperactivity disorder, predominantly inattentive type: Secondary | ICD-10-CM | POA: Diagnosis not present

## 2021-10-24 DIAGNOSIS — E782 Mixed hyperlipidemia: Secondary | ICD-10-CM

## 2021-10-24 DIAGNOSIS — R7302 Impaired glucose tolerance (oral): Secondary | ICD-10-CM | POA: Diagnosis not present

## 2021-10-24 DIAGNOSIS — F3181 Bipolar II disorder: Secondary | ICD-10-CM

## 2021-10-24 MED ORDER — SEMAGLUTIDE (1 MG/DOSE) 4 MG/3ML ~~LOC~~ SOPN: 1.0000 mg | PEN_INJECTOR | SUBCUTANEOUS | 0 refills | Status: DC

## 2021-10-24 NOTE — Progress Notes (Signed)
Established Patient Office Visit     CC/Reason for Visit: 2-month follow-up chronic medical conditions  HPI: Barry Myers is a 41 y.o. male who is coming in today for the above mentioned reasons. Past Medical History is significant for: Morbid obesity, hyperlipidemia, impaired glucose tolerance, ADHD, bipolar 2 disorder, generalized anxiety.  He is feeling well and has no acute concerns.  At last visit he was started on semaglutide 0.5 mg for his prediabetes and weight loss.  He has lost about 9 pounds since then.  He is wondering about increasing the dose.   Past Medical/Surgical History: Past Medical History:  Diagnosis Date   ADHD    Bipolar 2 disorder (HCC)    Colon polyps    GAD (generalized anxiety disorder)    Morbid obesity (HCC)     Past Surgical History:  Procedure Laterality Date   SEPTOPLASTY      Social History:  reports that he has quit smoking. His smoking use included cigarettes. He has never used smokeless tobacco. He reports current alcohol use. No history on file for drug use.  Allergies: Allergies  Allergen Reactions   Aleve [Naproxen] Hives   Benzoyl Peroxide Hives   Ceclor [Cefaclor] Hives    Family History:  Family History  Problem Relation Age of Onset   Colon polyps Father    Colon cancer Paternal Grandfather      Current Outpatient Medications:    ARIPiprazole (ABILIFY) 15 MG tablet, Take 15 mg by mouth daily., Disp: , Rfl:    atorvastatin (LIPITOR) 40 MG tablet, Take 1 tablet (40 mg total) by mouth daily., Disp: 90 tablet, Rfl: 1   busPIRone (BUSPAR) 30 MG tablet, Take 30 mg by mouth 2 (two) times daily., Disp: , Rfl:    finasteride (PROPECIA) 1 MG tablet, Take 1 mg by mouth daily., Disp: , Rfl:    Semaglutide, 1 MG/DOSE, 4 MG/3ML SOPN, Inject 1 mg as directed once a week., Disp: 9 mL, Rfl: 0   temazepam (RESTORIL) 30 MG capsule, Take 30 mg by mouth at bedtime., Disp: , Rfl:    VYVANSE 50 MG capsule, Take 50 mg by mouth every  morning., Disp: , Rfl:   Review of Systems:  Constitutional: Denies fever, chills, diaphoresis, appetite change and fatigue.  HEENT: Denies photophobia, eye pain, redness, hearing loss, ear pain, congestion, sore throat, rhinorrhea, sneezing, mouth sores, trouble swallowing, neck pain, neck stiffness and tinnitus.   Respiratory: Denies SOB, DOE, cough, chest tightness,  and wheezing.   Cardiovascular: Denies chest pain, palpitations and leg swelling.  Gastrointestinal: Denies nausea, vomiting, abdominal pain, diarrhea, constipation, blood in stool and abdominal distention.  Genitourinary: Denies dysuria, urgency, frequency, hematuria, flank pain and difficulty urinating.  Endocrine: Denies: hot or cold intolerance, sweats, changes in hair or nails, polyuria, polydipsia. Musculoskeletal: Denies myalgias, back pain, joint swelling, arthralgias and gait problem.  Skin: Denies pallor, rash and wound.  Neurological: Denies dizziness, seizures, syncope, weakness, light-headedness, numbness and headaches.  Hematological: Denies adenopathy. Easy bruising, personal or family bleeding history  Psychiatric/Behavioral: Denies suicidal ideation, mood changes, confusion, nervousness, sleep disturbance and agitation    Physical Exam: Vitals:   10/24/21 0715  BP: 102/72  Pulse: 83  Temp: (!) 97.4 F (36.3 C)  TempSrc: Oral  SpO2: 98%  Weight: (!) 302 lb 1.6 oz (137 kg)  Height: 5\' 11"  (1.803 m)    Body mass index is 42.13 kg/m.   Constitutional: NAD, calm, comfortable, obese, pleasant Eyes: PERRL, lids and  conjunctivae normal ENMT: Mucous membranes are moist.  Respiratory: clear to auscultation bilaterally, no wheezing, no crackles. Normal respiratory effort. No accessory muscle use.  Cardiovascular: Regular rate and rhythm, no murmurs / rubs / gallops. No extremity edema.  Neurologic: Grossly intact and nonfocal Psychiatric: Normal judgment and insight. Alert and oriented x 3. Normal mood.     Impression and Plan:  IGT (impaired glucose tolerance)  - Plan: Semaglutide, 1 MG/DOSE, 4 MG/3ML SOPN -Last A1c was 5.5 in January, increase semaglutide to 1 mg.  Morbid obesity (HCC) -Discussed healthy lifestyle, including increased physical activity and better food choices to promote weight loss. -Increase semaglutide from 0.5 to 1 mg weekly.  Mixed hyperlipidemia -Now on atorvastatin 40 mg daily with significant improvement in lipid profile.  As of April 2023 total cholesterol 139, triglycerides 128 and LDL 74.  Attention deficit hyperactivity disorder (ADHD), predominantly inattentive type Bipolar 2 disorder (HCC) GAD (generalized anxiety disorder) -Mood is stable, followed by psychiatry    Time spent:30 minutes reviewing chart, interviewing and examining patient and formulating plan of care.     Chaya Jan, MD [facility] at [facility]

## 2021-11-26 ENCOUNTER — Other Ambulatory Visit: Payer: Self-pay | Admitting: Internal Medicine

## 2021-11-26 DIAGNOSIS — E782 Mixed hyperlipidemia: Secondary | ICD-10-CM

## 2022-01-22 ENCOUNTER — Ambulatory Visit (INDEPENDENT_AMBULATORY_CARE_PROVIDER_SITE_OTHER): Payer: Managed Care, Other (non HMO) | Admitting: Internal Medicine

## 2022-01-22 ENCOUNTER — Encounter: Payer: Self-pay | Admitting: Internal Medicine

## 2022-01-22 VITALS — BP 100/72 | HR 80 | Temp 97.7°F | Ht 71.0 in | Wt 297.8 lb

## 2022-01-22 DIAGNOSIS — E782 Mixed hyperlipidemia: Secondary | ICD-10-CM | POA: Diagnosis not present

## 2022-01-22 DIAGNOSIS — F900 Attention-deficit hyperactivity disorder, predominantly inattentive type: Secondary | ICD-10-CM

## 2022-01-22 DIAGNOSIS — K635 Polyp of colon: Secondary | ICD-10-CM

## 2022-01-22 DIAGNOSIS — R7302 Impaired glucose tolerance (oral): Secondary | ICD-10-CM | POA: Diagnosis not present

## 2022-01-22 DIAGNOSIS — Z23 Encounter for immunization: Secondary | ICD-10-CM | POA: Diagnosis not present

## 2022-01-22 DIAGNOSIS — F411 Generalized anxiety disorder: Secondary | ICD-10-CM

## 2022-01-22 DIAGNOSIS — F3181 Bipolar II disorder: Secondary | ICD-10-CM

## 2022-01-22 LAB — POCT GLYCOSYLATED HEMOGLOBIN (HGB A1C): Hemoglobin A1C: 5.5 % (ref 4.0–5.6)

## 2022-01-22 MED ORDER — WEGOVY 1.7 MG/0.75ML ~~LOC~~ SOAJ: 1.7000 mg | SUBCUTANEOUS | 0 refills | Status: DC

## 2022-01-22 NOTE — Progress Notes (Signed)
Established Patient Office Visit     CC/Reason for Visit: 41-month follow-up chronic medical conditions  HPI: Barry Myers is a 41 y.o. male who is coming in today for the above mentioned reasons. Past Medical History is significant for: Morbid obesity, hyperlipidemia, impaired glucose tolerance, ADHD, GAD, bipolar 2 followed by psychiatry.  He is doing well and has no acute concerns or complaints.  Is requesting flu vaccine.   Past Medical/Surgical History: Past Medical History:  Diagnosis Date   ADHD    Bipolar 2 disorder ([place])    Colon polyps    GAD (generalized anxiety disorder)    Morbid obesity ([place])     Past Surgical History:  Procedure Laterality Date   SEPTOPLASTY      Social History:  reports that he has quit smoking. His smoking use included cigarettes. He has never used smokeless tobacco. He reports current alcohol use. No history on file for drug use.  Allergies: Allergies  Allergen Reactions   Aleve [Naproxen] Hives   Benzoyl Peroxide Hives   Ceclor [Cefaclor] Hives    Family History:  Family History  Problem Relation Age of Onset   Colon polyps Father    Colon cancer Paternal Grandfather      Current Outpatient Medications:    ARIPiprazole (ABILIFY) 15 MG tablet, Take 15 mg by mouth daily., Disp: , Rfl:    atorvastatin (LIPITOR) 40 MG tablet, TAKE 1 TABLET BY MOUTH EVERY DAY, Disp: 90 tablet, Rfl: 1   busPIRone (BUSPAR) 30 MG tablet, Take 30 mg by mouth 2 (two) times daily., Disp: , Rfl:    finasteride (PROPECIA) 1 MG tablet, Take 1 mg by mouth daily., Disp: , Rfl:    QUEtiapine (SEROQUEL) 25 MG tablet, Take 25 mg by mouth at bedtime., Disp: , Rfl:    Semaglutide-Weight Management (WEGOVY) 1.7 MG/0.75ML SOAJ, Inject 1.7 mg into the skin once a week., Disp: 3 mL, Rfl: 0   temazepam (RESTORIL) 30 MG capsule, Take 30 mg by mouth at bedtime., Disp: , Rfl:    VYVANSE 50 MG capsule, Take 50 mg by mouth every morning., Disp: , Rfl:   Review of  Systems:  Constitutional: Denies fever, chills, diaphoresis, appetite change and fatigue.  HEENT: Denies photophobia, eye pain, redness, hearing loss, ear pain, congestion, sore throat, rhinorrhea, sneezing, mouth sores, trouble swallowing, neck pain, neck stiffness and tinnitus.   Respiratory: Denies SOB, DOE, cough, chest tightness,  and wheezing.   Cardiovascular: Denies chest pain, palpitations and leg swelling.  Gastrointestinal: Denies nausea, vomiting, abdominal pain, diarrhea, constipation, blood in stool and abdominal distention.  Genitourinary: Denies dysuria, urgency, frequency, hematuria, flank pain and difficulty urinating.  Endocrine: Denies: hot or cold intolerance, sweats, changes in hair or nails, polyuria, polydipsia. Musculoskeletal: Denies myalgias, back pain, joint swelling, arthralgias and gait problem.  Skin: Denies pallor, rash and wound.  Neurological: Denies dizziness, seizures, syncope, weakness, light-headedness, numbness and headaches.  Hematological: Denies adenopathy. Easy bruising, personal or family bleeding history  Psychiatric/Behavioral: Denies suicidal ideation, mood changes, confusion, nervousness, sleep disturbance and agitation    Physical Exam: Vitals:   01/22/22 0718  BP: 100/72  Pulse: 80  Temp: 97.7 F (36.5 C)  TempSrc: Oral  SpO2: 98%  Weight: 297 lb 12.8 oz (135.1 kg)  Height: 5\' 11"  (1.803 m)    Body mass index is 41.53 kg/m.   Constitutional: NAD, calm, comfortable Eyes: PERRL, lids and conjunctivae normal ENMT: Mucous membranes are moist.  Respiratory: clear to auscultation bilaterally,  no wheezing, no crackles. Normal respiratory effort. No accessory muscle use.  Cardiovascular: Regular rate and rhythm, no murmurs / rubs / gallops. No extremity edema. Psychiatric: Normal judgment and insight. Alert and oriented x 3. Normal mood.    Impression and Plan:  IGT (impaired glucose tolerance) - Plan: POCT glycosylated hemoglobin  (Hb A1C), Semaglutide-Weight Management (WEGOVY) 1.7 MG/0.75ML SOAJ  Mixed hyperlipidemia  Morbid obesity (HCC)  Attention deficit hyperactivity disorder (ADHD), predominantly inattentive type  GAD (generalized anxiety disorder)  Bipolar 2 disorder (HCC)  Polyp of colon, unspecified part of colon, unspecified type  Need for influenza vaccination  -Flu vaccine administered today. -He has done well on semaglutide and has dropped another 5 pounds since last visit. -We will increase to 1.7 mg weekly. -Blood pressures well controlled. -A1c is 5.5. -Quetiapine was added by his psychiatrist to improve sleep.  Time spent:32 minutes reviewing chart, interviewing and examining patient and formulating plan of care.     Chaya Jan, MD [facility] at [facility]

## 2022-02-20 ENCOUNTER — Encounter: Payer: Self-pay | Admitting: Internal Medicine

## 2022-02-20 ENCOUNTER — Ambulatory Visit (INDEPENDENT_AMBULATORY_CARE_PROVIDER_SITE_OTHER): Payer: Managed Care, Other (non HMO) | Admitting: Internal Medicine

## 2022-02-20 DIAGNOSIS — R7302 Impaired glucose tolerance (oral): Secondary | ICD-10-CM

## 2022-02-20 MED ORDER — WEGOVY 2.4 MG/0.75ML ~~LOC~~ SOAJ: 2.4000 mg | SUBCUTANEOUS | 2 refills | Status: DC

## 2022-02-20 NOTE — Progress Notes (Signed)
Established Patient Office Visit     CC/Reason for Visit: Follow-up obesity, weight loss management  HPI: Barry Myers is a 41 y.o. male who is coming in today for the above mentioned reasons. Past Medical History is significant for: Morbid obesity, impaired glucose tolerance, hyperlipidemia, ADHD, GAD, bipolar 2 followed by psychiatry.  He is currently on semaglutide 1.7 mg injected weekly for management of his obesity and impaired glucose tolerance.  He has been tolerating this dose well.  Has only had 1 pound weight loss in the last month.  Is wondering about escalating dose.   Past Medical/Surgical History: Past Medical History:  Diagnosis Date   ADHD    Bipolar 2 disorder ([place])    Colon polyps    GAD (generalized anxiety disorder)    Morbid obesity ([place])     Past Surgical History:  Procedure Laterality Date   SEPTOPLASTY      Social History:  reports that he has quit smoking. His smoking use included cigarettes. He has never used smokeless tobacco. He reports current alcohol use. No history on file for drug use.  Allergies: Allergies  Allergen Reactions   Aleve [Naproxen] Hives   Benzoyl Peroxide Hives   Ceclor [Cefaclor] Hives    Family History:  Family History  Problem Relation Age of Onset   Colon polyps Father    Colon cancer Paternal Grandfather      Current Outpatient Medications:    ARIPiprazole (ABILIFY) 15 MG tablet, Take 15 mg by mouth daily., Disp: , Rfl:    atorvastatin (LIPITOR) 40 MG tablet, TAKE 1 TABLET BY MOUTH EVERY DAY, Disp: 90 tablet, Rfl: 1   busPIRone (BUSPAR) 30 MG tablet, Take 30 mg by mouth 2 (two) times daily., Disp: , Rfl:    finasteride (PROPECIA) 1 MG tablet, Take 1 mg by mouth daily., Disp: , Rfl:    QUEtiapine (SEROQUEL) 25 MG tablet, Take 25 mg by mouth at bedtime., Disp: , Rfl:    Semaglutide-Weight Management (WEGOVY) 2.4 MG/0.75ML SOAJ, Inject 2.4 mg into the skin once a week., Disp: 3 mL, Rfl: 2   temazepam (RESTORIL)  30 MG capsule, Take 30 mg by mouth at bedtime., Disp: , Rfl:    VYVANSE 50 MG capsule, Take 50 mg by mouth every morning., Disp: , Rfl:   Review of Systems:  Constitutional: Denies fever, chills, diaphoresis, appetite change and fatigue.  HEENT: Denies photophobia, eye pain, redness, hearing loss, ear pain, congestion, sore throat, rhinorrhea, sneezing, mouth sores, trouble swallowing, neck pain, neck stiffness and tinnitus.   Respiratory: Denies SOB, DOE, cough, chest tightness,  and wheezing.   Cardiovascular: Denies chest pain, palpitations and leg swelling.  Gastrointestinal: Denies nausea, vomiting, abdominal pain, diarrhea, constipation, blood in stool and abdominal distention.  Genitourinary: Denies dysuria, urgency, frequency, hematuria, flank pain and difficulty urinating.  Endocrine: Denies: hot or cold intolerance, sweats, changes in hair or nails, polyuria, polydipsia. Musculoskeletal: Denies myalgias, back pain, joint swelling, arthralgias and gait problem.  Skin: Denies pallor, rash and wound.  Neurological: Denies dizziness, seizures, syncope, weakness, light-headedness, numbness and headaches.  Hematological: Denies adenopathy. Easy bruising, personal or family bleeding history  Psychiatric/Behavioral: Denies suicidal ideation, mood changes, confusion, nervousness, sleep disturbance and agitation    Physical Exam: Vitals:   02/20/22 0721  BP: 110/80  Pulse: 100  Temp: 98.2 F (36.8 C)  TempSrc: Oral  SpO2: 98%  Weight: 297 lb (134.7 kg)    Body mass index is 41.42 kg/m.   Constitutional:  NAD, calm, comfortable Eyes: PERRL, lids and conjunctivae normal ENMT: Mucous membranes are moist.  Psychiatric: Normal judgment and insight. Alert and oriented x 3. Normal mood.    Impression and Plan:  Morbid obesity ([place]) - Plan: Semaglutide-Weight Management (WEGOVY) 2.4 MG/0.75ML SOAJ  IGT (impaired glucose tolerance) - Plan: Semaglutide-Weight Management (WEGOVY) 2.4  MG/0.75ML SOAJ  -Discussed healthy lifestyle, including increased physical activity and better food choices to promote weight loss. -We have decided to increase semaglutide from 1.7 to 2.4 mg injected weekly, follow-up in 3 to 6 months.  Time spent:30 minutes reviewing chart, interviewing and examining patient and formulating plan of care.       Lelon Frohlich, MD  Primary Care at [facility]

## 2022-05-27 ENCOUNTER — Ambulatory Visit (INDEPENDENT_AMBULATORY_CARE_PROVIDER_SITE_OTHER): Payer: 59 | Admitting: Internal Medicine

## 2022-05-27 ENCOUNTER — Other Ambulatory Visit: Payer: Self-pay | Admitting: Internal Medicine

## 2022-05-27 ENCOUNTER — Encounter: Payer: Self-pay | Admitting: Internal Medicine

## 2022-05-27 VITALS — BP 110/78 | HR 90 | Temp 97.4°F | Wt 306.7 lb

## 2022-05-27 DIAGNOSIS — L03032 Cellulitis of left toe: Secondary | ICD-10-CM | POA: Diagnosis not present

## 2022-05-27 DIAGNOSIS — R7302 Impaired glucose tolerance (oral): Secondary | ICD-10-CM | POA: Diagnosis not present

## 2022-05-27 LAB — POCT GLYCOSYLATED HEMOGLOBIN (HGB A1C): Hemoglobin A1C: 5.7 % — AB (ref 4.0–5.6)

## 2022-05-27 MED ORDER — DOXYCYCLINE MONOHYDRATE 100 MG PO CAPS: 100.0000 mg | ORAL_CAPSULE | Freq: Two times a day (BID) | ORAL | 0 refills | Status: DC

## 2022-05-27 NOTE — Progress Notes (Signed)
Established Patient Office Visit     CC/Reason for Visit: Toe pain  HPI: Barry Myers is a 42 y.o. male who is coming in today for the above mentioned reasons.  About 2 weeks ago he started noticing some tingling in the toes of his left foot.  Then he developed a "bruise" of the left second toe.  It is not extremely painful.  He does not recall any injuries or wearing any new shoes.  In addition he tells me that he had to discontinue the [facility] due to affordability issues.   Past Medical/Surgical History: Past Medical History:  Diagnosis Date   ADHD    Bipolar 2 disorder ([place])    Colon polyps    GAD (generalized anxiety disorder)    Morbid obesity ([place])     Past Surgical History:  Procedure Laterality Date   SEPTOPLASTY      Social History:  reports that he has quit smoking. His smoking use included cigarettes. He has never used smokeless tobacco. He reports current alcohol use. No history on file for drug use.  Allergies: Allergies  Allergen Reactions   Aleve [Naproxen] Hives   Benzoyl Peroxide Hives   Ceclor [Cefaclor] Hives    Family History:  Family History  Problem Relation Age of Onset   Colon polyps Father    Colon cancer Paternal Grandfather      Current Outpatient Medications:    ARIPiprazole (ABILIFY) 15 MG tablet, Take 15 mg by mouth daily., Disp: , Rfl:    atorvastatin (LIPITOR) 40 MG tablet, TAKE 1 TABLET BY MOUTH EVERY DAY, Disp: 90 tablet, Rfl: 1   busPIRone (BUSPAR) 30 MG tablet, Take 30 mg by mouth 2 (two) times daily., Disp: , Rfl:    doxycycline (MONODOX) 100 MG capsule, Take 1 capsule (100 mg total) by mouth 2 (two) times daily for 10 days., Disp: 20 capsule, Rfl: 0   finasteride (PROPECIA) 1 MG tablet, Take 1 mg by mouth daily., Disp: , Rfl:    QUEtiapine (SEROQUEL) 25 MG tablet, Take 25 mg by mouth at bedtime., Disp: , Rfl:    temazepam (RESTORIL) 30 MG capsule, Take 30 mg by mouth at bedtime., Disp: , Rfl:    VYVANSE 50 MG capsule, Take  50 mg by mouth every morning., Disp: , Rfl:   Review of Systems:  Negative unless indicated in HPI.   Physical Exam: Vitals:   05/27/22 0725  BP: 110/78  Pulse: 90  Temp: (!) 97.4 F (36.3 C)  TempSrc: Oral  SpO2: 98%  Weight: (!) 306 lb 11.2 oz (139.1 kg)    Body mass index is 42.78 kg/m.   Physical Exam Vitals reviewed.  Constitutional:      Appearance: Normal appearance.  HENT:     Head: Normocephalic and atraumatic.  Eyes:     Conjunctiva/sclera: Conjunctivae normal.     Pupils: Pupils are equal, round, and reactive to light.  Feet:     Comments: Redness and inflammation surrounding the nailbed of the left second toe Skin:    General: Skin is warm and dry.  Neurological:     General: No focal deficit present.     Mental Status: He is alert and oriented to person, place, and time.  Psychiatric:        Mood and Affect: Mood normal.        Behavior: Behavior normal.        Thought Content: Thought content normal.  Judgment: Judgment normal.      Impression and Plan:  IGT (impaired glucose tolerance) - Plan: Hemoglobin A1c, POC HgB A1c  Paronychia of second toe of left foot - Plan: doxycycline (MONODOX) 100 MG capsule  -Treat paronychia with doxycycline. -A1c remains stable in the prediabetic range at 5.7. -Unfortunately he has had to discontinue the [facility] as his new prescription drug plan does not have a weight loss benefit.   Time spent:30 minutes reviewing chart, interviewing and examining patient and formulating plan of care.     Lelon Frohlich, MD [facility] at [facility]

## 2022-05-29 ENCOUNTER — Other Ambulatory Visit: Payer: Self-pay | Admitting: Internal Medicine

## 2022-05-29 DIAGNOSIS — E782 Mixed hyperlipidemia: Secondary | ICD-10-CM

## 2022-06-30 ENCOUNTER — Telehealth: Payer: Self-pay | Admitting: Internal Medicine

## 2022-06-30 NOTE — Telephone Encounter (Signed)
Pt is calling and need PA wegovy

## 2022-07-01 NOTE — Telephone Encounter (Signed)
PA has been started Key: [id number]

## 2022-07-03 NOTE — Telephone Encounter (Signed)
PA was denied. Appeal started  Key: [employer]

## 2022-07-07 NOTE — Telephone Encounter (Signed)
Pt is aware appeal has been started

## 2022-07-08 NOTE — Telephone Encounter (Signed)
Fax sent with requested information.

## 2022-07-09 NOTE — Telephone Encounter (Signed)
Please wait for the payer to return a determination.

## 2022-07-22 NOTE — Telephone Encounter (Signed)
Pharmacy Patient Advocate Encounter  Received notification from [facility] that the request for appeal for Barry Myers has been denied due to [facility] has been determined as not medically neccessary.    Please be advised we currently do not have a Pharmacist to review denials, therefore you will need to process appeals accordingly as needed. Thanks for your support at this time.   You may fax [phone], to appeal.   Denial letter attached to chart

## 2022-07-23 ENCOUNTER — Other Ambulatory Visit (HOSPITAL_COMMUNITY): Payer: Self-pay

## 2022-07-24 NOTE — Telephone Encounter (Signed)
Office notes faxed to fax [phone]

## 2022-08-21 ENCOUNTER — Encounter: Payer: Self-pay | Admitting: Internal Medicine

## 2022-08-21 ENCOUNTER — Ambulatory Visit (INDEPENDENT_AMBULATORY_CARE_PROVIDER_SITE_OTHER): Payer: 59 | Admitting: Internal Medicine

## 2022-08-21 VITALS — BP 110/78 | HR 76 | Temp 97.6°F | Wt 313.2 lb

## 2022-08-21 DIAGNOSIS — F3181 Bipolar II disorder: Secondary | ICD-10-CM

## 2022-08-21 DIAGNOSIS — R7302 Impaired glucose tolerance (oral): Secondary | ICD-10-CM | POA: Diagnosis not present

## 2022-08-21 DIAGNOSIS — E782 Mixed hyperlipidemia: Secondary | ICD-10-CM | POA: Diagnosis not present

## 2022-08-21 DIAGNOSIS — F411 Generalized anxiety disorder: Secondary | ICD-10-CM

## 2022-08-21 DIAGNOSIS — Z6841 Body Mass Index (BMI) 40.0 and over, adult: Secondary | ICD-10-CM | POA: Diagnosis not present

## 2022-08-21 LAB — POCT GLYCOSYLATED HEMOGLOBIN (HGB A1C): Hemoglobin A1C: 5.9 % — AB (ref 4.0–5.6)

## 2022-08-21 LAB — HEMOGLOBIN A1C: Hgb A1c MFr Bld: 6.1 % (ref 4.6–6.5)

## 2022-08-21 NOTE — Assessment & Plan Note (Signed)
Due for lipid recheck. On atorvastatin 40 mg daily.

## 2022-08-21 NOTE — Assessment & Plan Note (Signed)
A1c is 5.6. Discussed lifestyle changes at length.

## 2022-08-21 NOTE — Assessment & Plan Note (Signed)
Followed by psychiatry. Mood is stable.

## 2022-08-21 NOTE — Assessment & Plan Note (Signed)
-  Discussed healthy lifestyle, including increased physical activity and better food choices to promote weight loss. -Unfortunately, insurance will no longer cover Wegovy.

## 2022-08-21 NOTE — Progress Notes (Signed)
Established Patient Office Visit     CC/Reason for Visit: Follow-up chronic conditions  HPI: Barry Myers is a 42 y.o. male who is coming in today for the above mentioned reasons. Past Medical History is significant for: Hypertension, hyperlipidemia, type 2 diabetes, morbid obesity, bipolar disorder followed by psychiatry.  He is feeling well.  Unfortunately his insurance dropped the prescription weight loss coverage.  He is no longer on Wegovy.  He has gained about 5 pounds since last visit.   Past Medical/Surgical History: Past Medical History:  Diagnosis Date   ADHD    Bipolar 2 disorder (HCC)    Colon polyps    GAD (generalized anxiety disorder)    Morbid obesity (HCC)     Past Surgical History:  Procedure Laterality Date   SEPTOPLASTY      Social History:  reports that he has quit smoking. His smoking use included cigarettes. He has never used smokeless tobacco. He reports current alcohol use. No history on file for drug use.  Allergies: Allergies  Allergen Reactions   Aleve [Naproxen] Hives   Benzoyl Peroxide Hives   Ceclor [Cefaclor] Hives    Family History:  Family History  Problem Relation Age of Onset   Colon polyps Father    Colon cancer Paternal Grandfather      Current Outpatient Medications:    ARIPiprazole (ABILIFY) 15 MG tablet, Take 15 mg by mouth daily., Disp: , Rfl:    atorvastatin (LIPITOR) 40 MG tablet, TAKE 1 TABLET BY MOUTH EVERY DAY, Disp: 90 tablet, Rfl: 0   busPIRone (BUSPAR) 30 MG tablet, Take 30 mg by mouth 2 (two) times daily., Disp: , Rfl:    QUEtiapine (SEROQUEL) 25 MG tablet, Take 25 mg by mouth at bedtime. (Patient not taking: Reported on 08/21/2022), Disp: , Rfl:    temazepam (RESTORIL) 30 MG capsule, Take 30 mg by mouth at bedtime. (Patient not taking: Reported on 08/21/2022), Disp: , Rfl:    VYVANSE 50 MG capsule, Take 50 mg by mouth every morning. (Patient not taking: Reported on 08/21/2022), Disp: , Rfl:   Review of Systems:   Negative unless indicated in HPI.   Physical Exam: Vitals:   08/21/22 0720  BP: 110/78  Pulse: 76  Temp: 97.6 F (36.4 C)  TempSrc: Oral  SpO2: 98%  Weight: (!) 313 lb 3.2 oz (142.1 kg)    Body mass index is 43.68 kg/m.   Physical Exam Vitals reviewed.  Constitutional:      Appearance: Normal appearance.  HENT:     Head: Normocephalic and atraumatic.  Eyes:     Conjunctiva/sclera: Conjunctivae normal.     Pupils: Pupils are equal, round, and reactive to light.  Cardiovascular:     Rate and Rhythm: Normal rate and regular rhythm.  Pulmonary:     Effort: Pulmonary effort is normal.     Breath sounds: Normal breath sounds.  Skin:    General: Skin is warm and dry.  Neurological:     General: No focal deficit present.     Mental Status: He is alert and oriented to person, place, and time.  Psychiatric:        Mood and Affect: Mood normal.        Behavior: Behavior normal.        Thought Content: Thought content normal.        Judgment: Judgment normal.      Impression and Plan:  IGT (impaired glucose tolerance) Assessment & Plan: A1c is 5.6.  Discussed lifestyle changes at length.  Orders: -     POCT glycosylated hemoglobin (Hb A1C)  Morbid obesity (HCC) Assessment & Plan: -Discussed healthy lifestyle, including increased physical activity and better food choices to promote weight loss. -Unfortunately, insurance will no longer cover Wegovy.  Orders: -     CBC with Differential/Platelet; Future -     Comprehensive metabolic panel; Future  Mixed hyperlipidemia Assessment & Plan: Due for lipid recheck. On atorvastatin 40 mg daily.  Orders: -     Lipid panel; Future  GAD (generalized anxiety disorder)  Bipolar 2 disorder (HCC) Assessment & Plan: Followed by psychiatry. Mood is stable.      Time spent:30 minutes reviewing chart, interviewing and examining patient and formulating plan of care.     Chaya Jan, MD [place] [facility] at [facility]

## 2022-08-23 ENCOUNTER — Other Ambulatory Visit: Payer: Self-pay | Admitting: Internal Medicine

## 2022-08-23 DIAGNOSIS — E782 Mixed hyperlipidemia: Secondary | ICD-10-CM

## 2022-12-05 ENCOUNTER — Other Ambulatory Visit: Payer: Self-pay | Admitting: Internal Medicine

## 2022-12-05 DIAGNOSIS — E782 Mixed hyperlipidemia: Secondary | ICD-10-CM

## 2023-03-06 ENCOUNTER — Other Ambulatory Visit: Payer: Self-pay | Admitting: Internal Medicine

## 2023-03-06 DIAGNOSIS — E782 Mixed hyperlipidemia: Secondary | ICD-10-CM

## 2023-06-18 ENCOUNTER — Other Ambulatory Visit: Payer: Self-pay | Admitting: Internal Medicine

## 2023-06-18 DIAGNOSIS — E782 Mixed hyperlipidemia: Secondary | ICD-10-CM

## 2023-09-24 ENCOUNTER — Other Ambulatory Visit: Payer: Self-pay | Admitting: Internal Medicine

## 2023-09-24 DIAGNOSIS — E782 Mixed hyperlipidemia: Secondary | ICD-10-CM

## 2023-12-20 ENCOUNTER — Other Ambulatory Visit: Payer: Self-pay | Admitting: Internal Medicine

## 2023-12-20 DIAGNOSIS — E782 Mixed hyperlipidemia: Secondary | ICD-10-CM

## 2023-12-28 ENCOUNTER — Telehealth: Payer: Self-pay | Admitting: Internal Medicine

## 2023-12-28 DIAGNOSIS — Z23 Encounter for immunization: Secondary | ICD-10-CM

## 2023-12-28 MED ORDER — COVID-19 MRNA VACC (MODERNA) 50 MCG/0.5ML IM SUSP
0.5000 mL | Freq: Once | INTRAMUSCULAR | 0 refills | Status: AC
Start: 2023-12-28 — End: 2023-12-28

## 2023-12-28 NOTE — Telephone Encounter (Signed)
 Patient would like a prescription for the COVID vaccine.  Was informed by [facility] that a prescription is needed.  Please advise at [phone].

## 2023-12-28 NOTE — Telephone Encounter (Signed)
 Rx sent.

## 2023-12-29 ENCOUNTER — Ambulatory Visit: Admitting: Internal Medicine

## 2024-01-16 ENCOUNTER — Other Ambulatory Visit: Payer: Self-pay | Admitting: Internal Medicine

## 2024-01-16 DIAGNOSIS — E782 Mixed hyperlipidemia: Secondary | ICD-10-CM

## 2024-04-11 ENCOUNTER — Encounter: Payer: Self-pay | Admitting: Internal Medicine

## 2024-04-11 ENCOUNTER — Ambulatory Visit: Admitting: Internal Medicine

## 2024-04-11 VITALS — BP 120/84 | HR 94 | Ht 71.0 in | Wt 299.1 lb

## 2024-04-11 DIAGNOSIS — Z0000 Encounter for general adult medical examination without abnormal findings: Secondary | ICD-10-CM | POA: Diagnosis not present

## 2024-04-11 DIAGNOSIS — Z114 Encounter for screening for human immunodeficiency virus [HIV]: Secondary | ICD-10-CM

## 2024-04-11 DIAGNOSIS — Z1159 Encounter for screening for other viral diseases: Secondary | ICD-10-CM

## 2024-04-11 DIAGNOSIS — Z6841 Body Mass Index (BMI) 40.0 and over, adult: Secondary | ICD-10-CM

## 2024-04-11 DIAGNOSIS — F3181 Bipolar II disorder: Secondary | ICD-10-CM

## 2024-04-11 DIAGNOSIS — E782 Mixed hyperlipidemia: Secondary | ICD-10-CM | POA: Diagnosis not present

## 2024-04-11 DIAGNOSIS — R7302 Impaired glucose tolerance (oral): Secondary | ICD-10-CM | POA: Diagnosis not present

## 2024-04-11 LAB — COMPREHENSIVE METABOLIC PANEL WITH GFR
ALT: 30 U/L (ref 3–53)
AST: 22 U/L (ref 5–37)
Albumin: 4.5 g/dL (ref 3.5–5.2)
Alkaline Phosphatase: 86 U/L (ref 39–117)
BUN: 14 mg/dL (ref 6–23)
CO2: 25 meq/L (ref 19–32)
Calcium: 9.1 mg/dL (ref 8.4–10.5)
Chloride: 105 meq/L (ref 96–112)
Creatinine, Ser: 1.06 mg/dL (ref 0.40–1.50)
GFR: 85.85 mL/min
Glucose, Bld: 119 mg/dL — ABNORMAL HIGH (ref 70–99)
Potassium: 4.4 meq/L (ref 3.5–5.1)
Sodium: 138 meq/L (ref 135–145)
Total Bilirubin: 0.6 mg/dL (ref 0.2–1.2)
Total Protein: 7 g/dL (ref 6.0–8.3)

## 2024-04-11 LAB — PSA: PSA: 0.57 ng/mL (ref 0.10–4.00)

## 2024-04-11 LAB — CBC WITH DIFFERENTIAL/PLATELET
Basophils Absolute: 0.1 K/uL (ref 0.0–0.1)
Basophils Relative: 0.9 % (ref 0.0–3.0)
Eosinophils Absolute: 0.3 K/uL (ref 0.0–0.7)
Eosinophils Relative: 4.4 % (ref 0.0–5.0)
HCT: 46.6 % (ref 39.0–52.0)
Hemoglobin: 16 g/dL (ref 13.0–17.0)
Lymphocytes Relative: 28.4 % (ref 12.0–46.0)
Lymphs Abs: 1.7 K/uL (ref 0.7–4.0)
MCHC: 34.3 g/dL (ref 30.0–36.0)
MCV: 86.1 fl (ref 78.0–100.0)
Monocytes Absolute: 0.4 K/uL (ref 0.1–1.0)
Monocytes Relative: 7.2 % (ref 3.0–12.0)
Neutro Abs: 3.6 K/uL (ref 1.4–7.7)
Neutrophils Relative %: 59.1 % (ref 43.0–77.0)
Platelets: 244 K/uL (ref 150.0–400.0)
RBC: 5.42 Mil/uL (ref 4.22–5.81)
RDW: 12.7 % (ref 11.5–15.5)
WBC: 6.1 K/uL (ref 4.0–10.5)

## 2024-04-11 LAB — LIPID PANEL
Cholesterol: 133 mg/dL (ref 28–200)
HDL: 37.7 mg/dL — ABNORMAL LOW
LDL Cholesterol: 73 mg/dL (ref 10–99)
NonHDL: 95.54
Total CHOL/HDL Ratio: 4
Triglycerides: 115 mg/dL (ref 10.0–149.0)
VLDL: 23 mg/dL (ref 0.0–40.0)

## 2024-04-11 LAB — TSH: TSH: 1.58 u[IU]/mL (ref 0.35–5.50)

## 2024-04-11 LAB — HEMOGLOBIN A1C: Hgb A1c MFr Bld: 5.6 % (ref 4.6–6.5)

## 2024-04-11 LAB — VITAMIN D 25 HYDROXY (VIT D DEFICIENCY, FRACTURES): VITD: 22.01 ng/mL — ABNORMAL LOW (ref 30.00–100.00)

## 2024-04-11 LAB — VITAMIN B12: Vitamin B-12: 199 pg/mL — ABNORMAL LOW (ref 211–911)

## 2024-04-11 MED ORDER — WEGOVY 2.4 MG/0.75ML ~~LOC~~ SOAJ: 2.4000 mg | SUBCUTANEOUS | 2 refills | Status: AC

## 2024-04-11 NOTE — Progress Notes (Signed)
 "    Established Patient Office Visit     CC/Reason for Visit: Annual preventive exam  HPI: Barry Myers is a 43 y.o. male who is coming in today for the above mentioned reasons. Past Medical History is significant for: Hypertension, hyperlipidemia, morbid obesity, impaired glucose tolerance, bipolar 2 disorder.  Feeling well.  He is back on Wegovy  through an online service and would like me to prescribe instead.  Unsure if insurance will cover.  Immunizations are up-to-date.  Overdue for an eye exam.   Past Medical/Surgical History: Past Medical History:  Diagnosis Date   ADHD    Bipolar 2 disorder (HCC)    Colon polyps    GAD (generalized anxiety disorder)    Morbid obesity (HCC)     Past Surgical History:  Procedure Laterality Date   SEPTOPLASTY      Social History:  reports that he has quit smoking. His smoking use included cigarettes. He has never used smokeless tobacco. He reports current alcohol use. No history on file for drug use.  Allergies: Allergies[1]  Family History:  Family History  Problem Relation Age of Onset   Colon polyps Father    Colon cancer Paternal Grandfather     Current Medications[2]  Review of Systems:  Negative unless indicated in HPI.   Physical Exam: Vitals:   04/11/24 0735  BP: 120/84  Pulse: 94  SpO2: 98%  Weight: 299 lb 1.6 oz (135.7 kg)  Height: 5' 11 (1.803 m)    Body mass index is 41.72 kg/m.   Physical Exam Vitals reviewed.  Constitutional:      General: He is not in acute distress.    Appearance: Normal appearance. He is not ill-appearing, toxic-appearing or diaphoretic.  HENT:     Head: Normocephalic.     Right Ear: Tympanic membrane, ear canal and external ear normal. There is no impacted cerumen.     Left Ear: Tympanic membrane, ear canal and external ear normal. There is no impacted cerumen.     Nose: Nose normal.     Mouth/Throat:     Mouth: Mucous membranes are moist.     Pharynx: Oropharynx is  clear. No oropharyngeal exudate or posterior oropharyngeal erythema.  Eyes:     General: No scleral icterus.       Right eye: No discharge.        Left eye: No discharge.     Conjunctiva/sclera: Conjunctivae normal.     Pupils: Pupils are equal, round, and reactive to light.  Neck:     Vascular: No carotid bruit.  Cardiovascular:     Rate and Rhythm: Normal rate and regular rhythm.     Pulses: Normal pulses.     Heart sounds: Normal heart sounds.  Pulmonary:     Effort: Pulmonary effort is normal. No respiratory distress.     Breath sounds: Normal breath sounds.  Abdominal:     General: Abdomen is flat. Bowel sounds are normal.     Palpations: Abdomen is soft.  Musculoskeletal:        General: Normal range of motion.     Cervical back: Normal range of motion.  Skin:    General: Skin is warm and dry.  Neurological:     General: No focal deficit present.     Mental Status: He is alert and oriented to person, place, and time. Mental status is at baseline.  Psychiatric:        Mood and Affect: Mood normal.  Behavior: Behavior normal.        Thought Content: Thought content normal.        Judgment: Judgment normal.      Impression and Plan:  Encounter for preventive health examination -     PSA; Future  Mixed hyperlipidemia -     Lipid panel; Future  IGT (impaired glucose tolerance) -     Hemoglobin A1c; Future -     Wegovy ; Inject 2.4 mg into the skin once a week.  Dispense: 3 mL; Refill: 2  Morbid obesity (HCC) -     CBC with Differential/Platelet; Future -     Comprehensive metabolic panel with GFR; Future -     TSH; Future -     Vitamin B12; Future -     VITAMIN D  25 Hydroxy (Vit-D Deficiency, Fractures); Future -     Wegovy ; Inject 2.4 mg into the skin once a week.  Dispense: 3 mL; Refill: 2  Bipolar 2 disorder (HCC)  Encounter for hepatitis C screening test for low risk patient -     Hepatitis C antibody; Future  Encounter for screening for HIV -      HIV Antibody (routine testing w rflx); Future   -Recommend routine eye and dental care. -Healthy lifestyle discussed in detail. -Labs to be updated today. -Prostate cancer screening: PSA today Health Maintenance  Topic Date Due   HIV Screening  Never done   Hepatitis C Screening  Never done   Hepatitis B Vaccine (1 of 3 - 19+ 3-dose series) Never done   HPV Vaccine (1 - 3-dose SCDM series) Never done   DTaP/Tdap/Td vaccine (3 - Td or Tdap) 04/12/2027   Flu Shot  Completed   COVID-19 Vaccine  Completed   Pneumococcal Vaccine  Aged Out   Meningitis B Vaccine  Aged Out     - Immunizations are up-to-date.  Advised to update eye exam.    Barry Theophilus Andrews, MD [facility]     [1]  Allergies Allergen Reactions   Aleve [Naproxen] Hives   Benzoyl Peroxide Hives   Ceclor [Cefaclor] Hives  [2]  Current Outpatient Medications:    ARIPiprazole (ABILIFY) 15 MG tablet, Take 15 mg by mouth daily., Disp: , Rfl:    atorvastatin  (LIPITOR) 40 MG tablet, TAKE 1 TABLET BY MOUTH EVERY DAY, Disp: 90 tablet, Rfl: 0   busPIRone (BUSPAR) 30 MG tablet, Take 30 mg by mouth 2 (two) times daily., Disp: , Rfl:    temazepam (RESTORIL) 30 MG capsule, Take 30 mg by mouth at bedtime., Disp: , Rfl:    semaglutide -weight management (WEGOVY ) 2.4 MG/0.75ML SOAJ SQ injection, Inject 2.4 mg into the skin once a week., Disp: 3 mL, Rfl: 2  "

## 2024-04-12 LAB — HIV ANTIBODY (ROUTINE TESTING W REFLEX)
HIV 1&2 Ab, 4th Generation: NONREACTIVE
HIV FINAL INTERPRETATION: NEGATIVE

## 2024-04-12 LAB — HEPATITIS C ANTIBODY: Hepatitis C Ab: NONREACTIVE

## 2024-04-13 ENCOUNTER — Encounter: Admitting: Internal Medicine

## 2024-04-19 ENCOUNTER — Ambulatory Visit: Payer: Self-pay | Admitting: Internal Medicine

## 2024-04-19 DIAGNOSIS — E538 Deficiency of other specified B group vitamins: Secondary | ICD-10-CM | POA: Insufficient documentation

## 2024-04-19 DIAGNOSIS — E559 Vitamin D deficiency, unspecified: Secondary | ICD-10-CM | POA: Insufficient documentation

## 2024-04-19 MED ORDER — VITAMIN D (ERGOCALCIFEROL) 1.25 MG (50000 UNIT) PO CAPS: 50000.0000 [IU] | ORAL_CAPSULE | ORAL | 0 refills | Status: AC

## 2024-04-22 ENCOUNTER — Ambulatory Visit

## 2024-04-22 DIAGNOSIS — E538 Deficiency of other specified B group vitamins: Secondary | ICD-10-CM | POA: Diagnosis not present

## 2024-04-22 MED ORDER — CYANOCOBALAMIN 1000 MCG/ML IJ SOLN
1000.0000 ug | Freq: Once | INTRAMUSCULAR | Status: AC
  Administered 2024-04-22: 1000 ug via INTRAMUSCULAR

## 2024-04-22 NOTE — Progress Notes (Signed)
 Patient is in office today for a nurse visit for B12 Injection. Patient Injection was given in the  Left deltoid. Patient tolerated injection well.

## 2024-04-29 ENCOUNTER — Ambulatory Visit

## 2024-04-29 DIAGNOSIS — E538 Deficiency of other specified B group vitamins: Secondary | ICD-10-CM

## 2024-04-29 MED ORDER — CYANOCOBALAMIN 1000 MCG/ML IJ SOLN
1000.0000 ug | Freq: Once | INTRAMUSCULAR | Status: AC
  Administered 2024-04-29: 1000 ug via INTRAMUSCULAR

## 2024-04-29 NOTE — Progress Notes (Signed)
 Patient is in office today for a nurse visit for B12 Injection. Patient Injection was given in the  Right deltoid. Patient tolerated injection well.

## 2024-05-06 ENCOUNTER — Ambulatory Visit

## 2024-05-06 ENCOUNTER — Other Ambulatory Visit: Payer: Self-pay | Admitting: Internal Medicine

## 2024-05-06 DIAGNOSIS — E782 Mixed hyperlipidemia: Secondary | ICD-10-CM

## 2024-05-06 DIAGNOSIS — E538 Deficiency of other specified B group vitamins: Secondary | ICD-10-CM

## 2024-05-06 MED ORDER — CYANOCOBALAMIN 1000 MCG/ML IJ SOLN
1000.0000 ug | Freq: Once | INTRAMUSCULAR | Status: AC
  Administered 2024-05-06: 1000 ug via INTRAMUSCULAR

## 2024-05-06 NOTE — Progress Notes (Signed)
 Patient is in office today for a nurse visit for B12 Injection. Patient Injection was given in the  Left deltoid. Patient tolerated injection well.

## 2024-05-13 ENCOUNTER — Ambulatory Visit

## 2024-05-13 DIAGNOSIS — E538 Deficiency of other specified B group vitamins: Secondary | ICD-10-CM

## 2024-05-13 MED ORDER — CYANOCOBALAMIN 1000 MCG/ML IJ SOLN
1000.0000 ug | Freq: Once | INTRAMUSCULAR | Status: AC
  Administered 2024-05-13: 1000 ug via INTRAMUSCULAR

## 2024-05-13 NOTE — Progress Notes (Signed)
 Patient is in office today for a nurse visit for B12 Injection. Patient Injection was given in the  Right deltoid. Patient tolerated injection well.

## 2024-06-13 ENCOUNTER — Ambulatory Visit

## 2024-07-11 ENCOUNTER — Ambulatory Visit: Admitting: Internal Medicine
